# Patient Record
Sex: Female | Born: 1980 | Race: Black or African American | Hispanic: No | Smoking: Never smoker
Health system: Southern US, Community
[De-identification: ages and names within clinical notes are randomized; demographics above are authoritative.]

## PROBLEM LIST (undated history)

## (undated) HISTORY — PX: APPENDECTOMY: SHX54

---

## 2013-11-05 ENCOUNTER — Ambulatory Visit: Payer: Self-pay

## 2013-11-05 ENCOUNTER — Other Ambulatory Visit: Payer: Self-pay | Admitting: Occupational Medicine

## 2013-11-05 DIAGNOSIS — R7612 Nonspecific reaction to cell mediated immunity measurement of gamma interferon antigen response without active tuberculosis: Secondary | ICD-10-CM

## 2015-02-04 IMAGING — CR DG CHEST 1V
1 series · 1 of 1 positions shown · non-contrast
Comparison: None.

CLINICAL DATA: Positive C room TB test.

EXAM:
CHEST - 1 VIEW

[view not recorded]
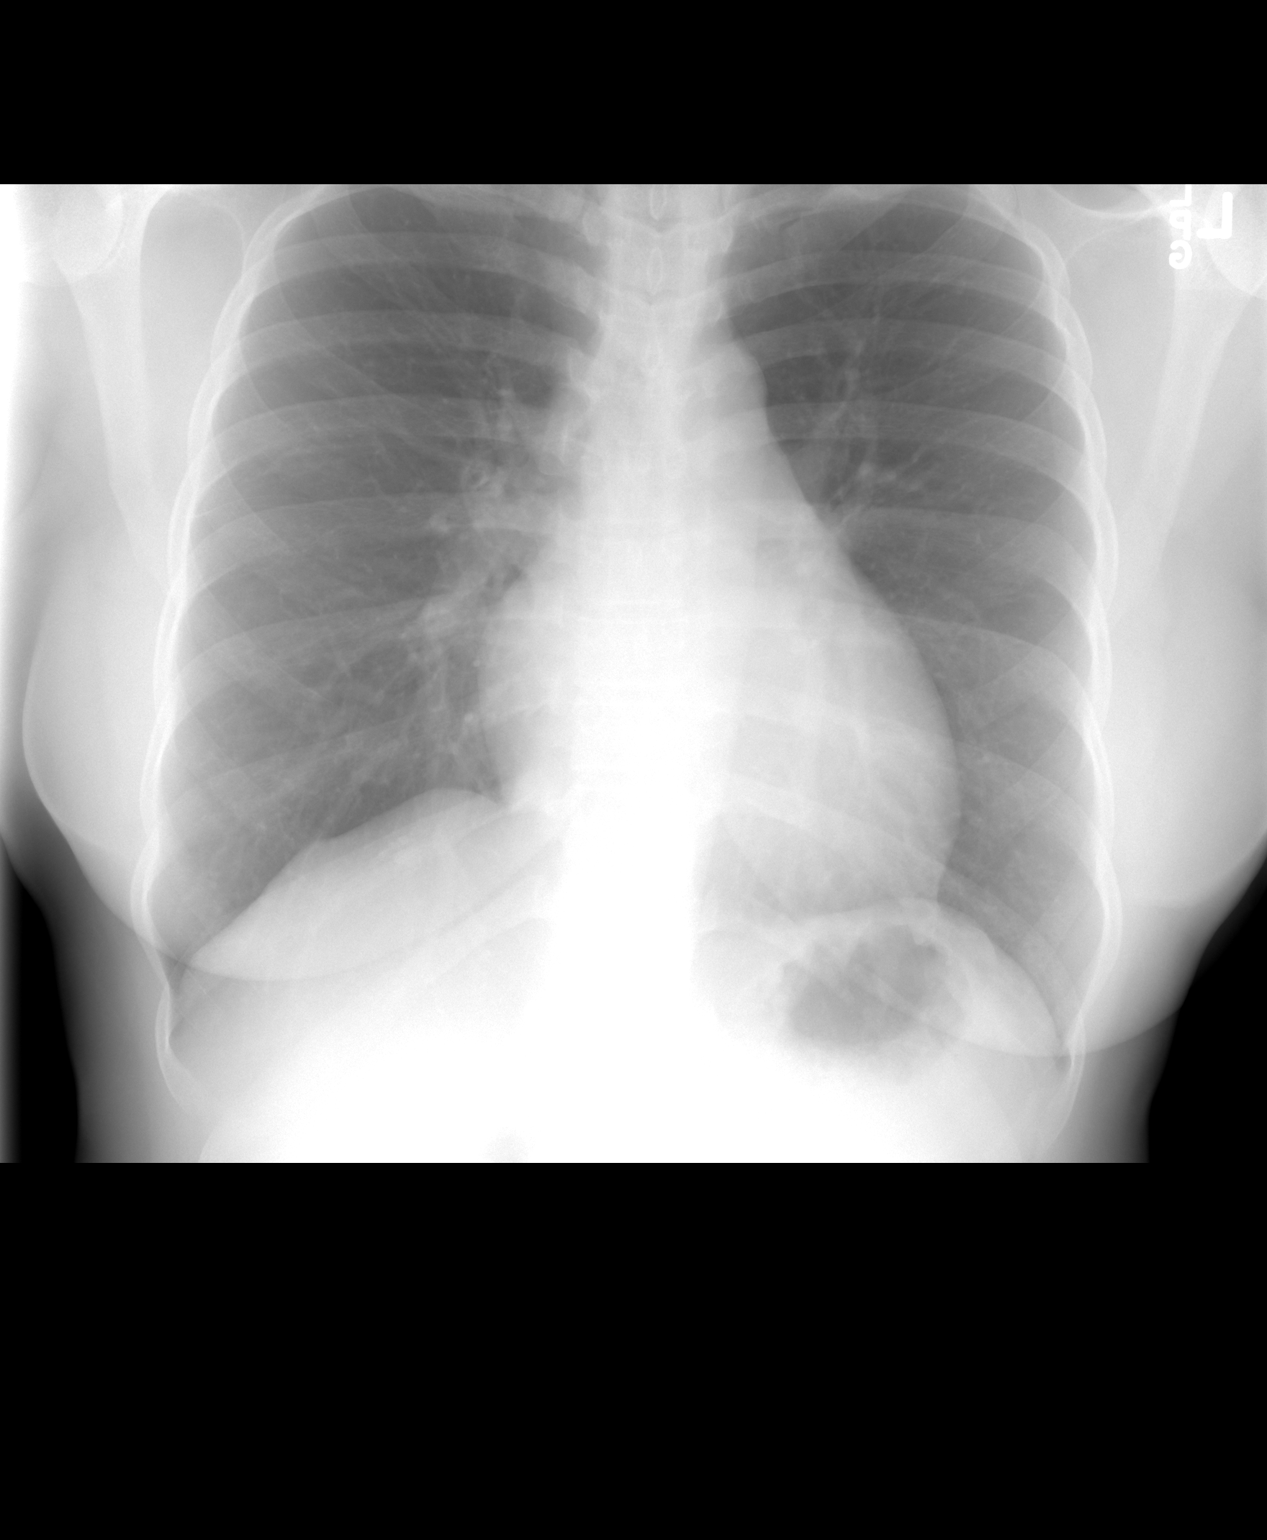

[1 of 1 positions shown; findings below may reference images not displayed]

FINDINGS: The heart size and mediastinal contours are within normal limits.
Both lungs are clear. The visualized skeletal structures are
unremarkable.
IMPRESSION: No active disease.  No evidence of active pulmonary tuberculosis.

## 2015-07-29 DIAGNOSIS — R509 Fever, unspecified: Secondary | ICD-10-CM | POA: Diagnosis not present

## 2015-07-29 DIAGNOSIS — G43109 Migraine with aura, not intractable, without status migrainosus: Secondary | ICD-10-CM | POA: Diagnosis not present

## 2015-07-29 DIAGNOSIS — J101 Influenza due to other identified influenza virus with other respiratory manifestations: Secondary | ICD-10-CM | POA: Diagnosis not present

## 2015-09-20 DIAGNOSIS — Z8249 Family history of ischemic heart disease and other diseases of the circulatory system: Secondary | ICD-10-CM | POA: Diagnosis not present

## 2015-09-20 DIAGNOSIS — I440 Atrioventricular block, first degree: Secondary | ICD-10-CM | POA: Diagnosis not present

## 2015-09-20 DIAGNOSIS — R079 Chest pain, unspecified: Secondary | ICD-10-CM | POA: Diagnosis not present

## 2015-09-20 DIAGNOSIS — M545 Low back pain: Secondary | ICD-10-CM | POA: Diagnosis not present

## 2016-04-10 DIAGNOSIS — Z30432 Encounter for removal of intrauterine contraceptive device: Secondary | ICD-10-CM | POA: Diagnosis not present

## 2016-04-10 DIAGNOSIS — Z01419 Encounter for gynecological examination (general) (routine) without abnormal findings: Secondary | ICD-10-CM | POA: Diagnosis not present

## 2016-04-10 DIAGNOSIS — Z6829 Body mass index (BMI) 29.0-29.9, adult: Secondary | ICD-10-CM | POA: Diagnosis not present

## 2016-04-10 DIAGNOSIS — Z13220 Encounter for screening for lipoid disorders: Secondary | ICD-10-CM | POA: Diagnosis not present

## 2016-06-20 DIAGNOSIS — H10022 Other mucopurulent conjunctivitis, left eye: Secondary | ICD-10-CM | POA: Diagnosis not present

## 2016-06-20 DIAGNOSIS — J309 Allergic rhinitis, unspecified: Secondary | ICD-10-CM | POA: Diagnosis not present

## 2016-06-22 DIAGNOSIS — B301 Conjunctivitis due to adenovirus: Secondary | ICD-10-CM | POA: Diagnosis not present

## 2017-04-06 DIAGNOSIS — G8929 Other chronic pain: Secondary | ICD-10-CM | POA: Diagnosis not present

## 2017-04-06 DIAGNOSIS — D500 Iron deficiency anemia secondary to blood loss (chronic): Secondary | ICD-10-CM | POA: Diagnosis not present

## 2017-04-06 DIAGNOSIS — M5441 Lumbago with sciatica, right side: Secondary | ICD-10-CM | POA: Diagnosis not present

## 2017-04-06 DIAGNOSIS — R635 Abnormal weight gain: Secondary | ICD-10-CM | POA: Diagnosis not present

## 2017-04-06 DIAGNOSIS — Z0000 Encounter for general adult medical examination without abnormal findings: Secondary | ICD-10-CM | POA: Diagnosis not present

## 2018-01-24 DIAGNOSIS — R070 Pain in throat: Secondary | ICD-10-CM | POA: Diagnosis not present

## 2018-01-24 DIAGNOSIS — R490 Dysphonia: Secondary | ICD-10-CM | POA: Diagnosis not present

## 2018-02-14 DIAGNOSIS — Z30432 Encounter for removal of intrauterine contraceptive device: Secondary | ICD-10-CM | POA: Diagnosis not present

## 2018-02-14 DIAGNOSIS — Z30430 Encounter for insertion of intrauterine contraceptive device: Secondary | ICD-10-CM | POA: Diagnosis not present

## 2018-02-14 DIAGNOSIS — Z3202 Encounter for pregnancy test, result negative: Secondary | ICD-10-CM | POA: Diagnosis not present

## 2018-02-14 DIAGNOSIS — Z124 Encounter for screening for malignant neoplasm of cervix: Secondary | ICD-10-CM | POA: Diagnosis not present

## 2018-02-15 DIAGNOSIS — Z124 Encounter for screening for malignant neoplasm of cervix: Secondary | ICD-10-CM | POA: Diagnosis not present

## 2018-04-24 DIAGNOSIS — R490 Dysphonia: Secondary | ICD-10-CM | POA: Diagnosis not present

## 2018-04-24 DIAGNOSIS — J383 Other diseases of vocal cords: Secondary | ICD-10-CM | POA: Diagnosis not present

## 2018-04-26 DIAGNOSIS — M5441 Lumbago with sciatica, right side: Secondary | ICD-10-CM | POA: Diagnosis not present

## 2018-04-26 DIAGNOSIS — Z23 Encounter for immunization: Secondary | ICD-10-CM | POA: Diagnosis not present

## 2018-04-26 DIAGNOSIS — Z0000 Encounter for general adult medical examination without abnormal findings: Secondary | ICD-10-CM | POA: Diagnosis not present

## 2018-04-26 DIAGNOSIS — D500 Iron deficiency anemia secondary to blood loss (chronic): Secondary | ICD-10-CM | POA: Diagnosis not present

## 2018-05-30 DIAGNOSIS — L658 Other specified nonscarring hair loss: Secondary | ICD-10-CM | POA: Diagnosis not present

## 2018-05-30 DIAGNOSIS — L668 Other cicatricial alopecia: Secondary | ICD-10-CM | POA: Diagnosis not present

## 2018-07-04 ENCOUNTER — Ambulatory Visit (INDEPENDENT_AMBULATORY_CARE_PROVIDER_SITE_OTHER): Payer: Self-pay | Admitting: Physician Assistant

## 2018-07-04 ENCOUNTER — Encounter: Payer: Self-pay | Admitting: Physician Assistant

## 2018-07-04 VITALS — BP 120/84 | HR 85 | Temp 99.5°F | Resp 16 | Ht 62.0 in | Wt 166.0 lb

## 2018-07-04 DIAGNOSIS — R6889 Other general symptoms and signs: Secondary | ICD-10-CM

## 2018-07-04 MED ORDER — OSELTAMIVIR PHOSPHATE 75 MG PO CAPS: 75.0000 mg | ORAL_CAPSULE | Freq: Two times a day (BID) | ORAL | 0 refills | Status: AC

## 2018-07-04 NOTE — Patient Instructions (Signed)
Thank you for choosing [facility] for your health care needs  You have been diagnosed with flu-like symptoms (the Flu).  You have been prescribed Tamiflu: take one 75mg  tablet twice a day x 5 days.  Rest. Increase fluids. Use cough & cold medication for symptom relief. Take tylenol or ibuprofen for fever and body aches.  You are contagious; practice good hand hygiene, cough in to elbow, use Clorox wipes to clean work/home area.  Follow-up with family physician or urgent care in 4-5 days if symptoms not improving.  Influenza, Adult Influenza is also called "the flu." It is an infection in the lungs, nose, and throat (respiratory tract). It is caused by a virus. The flu causes symptoms that are similar to symptoms of a cold. It also causes a high fever and body aches. The flu spreads easily from person to person (is contagious). Getting a flu shot (influenza vaccination) every year is the best way to prevent the flu. What are the causes? This condition is caused by the influenza virus. You can get the virus by:  Breathing in droplets that are in the air from the cough or sneeze of a person who has the virus.  Touching something that has the virus on it (is contaminated) and then touching your mouth, nose, or eyes. What increases the risk? Certain things may make you more likely to get the flu. These include:  Not washing your hands often.  Having close contact with many people during cold and flu season.  Touching your mouth, eyes, or nose without first washing your hands.  Not getting a flu shot every year. You may have a higher risk for the flu, along with serious problems such as a lung infection (pneumonia), if you:  Are older than 65.  Are pregnant.  Have a weakened disease-fighting system (immune system) because of a disease or taking certain medicines.  Have a long-term (chronic) illness, such as: ? Heart, kidney, or lung disease. ? Diabetes. ? Asthma.  Have a liver  disorder.  Are very overweight (morbidly obese).  Have anemia. This is a condition that affects your red blood cells. What are the signs or symptoms? Symptoms usually begin suddenly and last 4-14 days. They may include:  Fever and chills.  Headaches, body aches, or muscle aches.  Sore throat.  Cough.  Runny or stuffy (congested) nose.  Chest discomfort.  Not wanting to eat as much as normal (poor appetite).  Weakness or feeling tired (fatigue).  Dizziness.  Feeling sick to your stomach (nauseous) or throwing up (vomiting). How is this treated? If the flu is found early, you can be treated with medicine that can help reduce how bad the illness is and how long it lasts (antiviral medicine). This may be given by mouth (orally) or through an IV tube. Taking care of yourself at home can help your symptoms get better. Your doctor may suggest:  Taking over-the-counter medicines.  Drinking plenty of fluids. The flu often goes away on its own. If you have very bad symptoms or other problems, you may be treated in a hospital. Follow these instructions at home:     Activity  Rest as needed. Get plenty of sleep.  Stay home from work or school as told by your doctor. ? Do not leave home until you do not have a fever for 24 hours without taking medicine. ? Leave home only to visit your doctor. Eating and drinking  Take an ORS (oral rehydration solution). This is a  drink that is sold at pharmacies and stores.  Drink enough fluid to keep your pee (urine) pale yellow.  Drink clear fluids in small amounts as you are able. Clear fluids include: ? Water. ? Ice chips. ? Fruit juice that has water added (diluted fruit juice). ? Low-calorie sports drinks.  Eat bland, easy-to-digest foods in small amounts as you are able. These foods include: ? Bananas. ? Applesauce. ? Rice. ? Lean meats. ? Toast. ? Crackers.  Do not eat or drink: ? Fluids that have a lot of sugar or  caffeine. ? Alcohol. ? Spicy or fatty foods. General instructions  Take over-the-counter and prescription medicines only as told by your doctor.  Use a cool mist humidifier to add moisture to the air in your home. This can make it easier for you to breathe.  Cover your mouth and nose when you cough or sneeze.  Wash your hands with soap and water often, especially after you cough or sneeze. If you cannot use soap and water, use alcohol-based hand sanitizer.  Keep all follow-up visits as told by your doctor. This is important. How is this prevented?   Get a flu shot every year. You may get the flu shot in late summer, fall, or winter. Ask your doctor when you should get your flu shot.  Avoid contact with people who are sick during fall and winter (cold and flu season). Contact a doctor if:  You get new symptoms.  You have: ? Chest pain. ? Watery poop (diarrhea). ? A fever.  Your cough gets worse.  You start to have more mucus.  You feel sick to your stomach.  You throw up. Get help right away if you:  Have shortness of breath.  Have trouble breathing.  Have skin or nails that turn a bluish color.  Have very bad pain or stiffness in your neck.  Get a sudden headache.  Get sudden pain in your face or ear.  Cannot eat or drink without throwing up. Summary  Influenza ("the flu") is an infection in the lungs, nose, and throat. It is caused by a virus.  Take over-the-counter and prescription medicines only as told by your doctor.  Getting a flu shot every year is the best way to avoid getting the flu. This information is not intended to replace advice given to you by your health care provider. Make sure you discuss any questions you have with your health care provider. Document Released: 02/15/2008 Document Revised: 10/24/2017 Document Reviewed: 10/24/2017 Elsevier Interactive Patient Education  2019 [employer]

## 2018-07-04 NOTE — Progress Notes (Signed)
Patient ID: Kathleen Banks DOB: 1980/12/25 AGE: 38 y.o. MRN: [id number]   PCP: No primary care provider on file.   Chief Complaint:  Chief Complaint  Patient presents with  . Generalized Body Aches    x2d  . Chills    x2d     Subjective:    HPI:  Kathleen Banks is a 38 y.o. female presents for evaluation  Chief Complaint  Patient presents with  . Generalized Body Aches    x2d  . Chills    x63d    38 year old female presents to [facility] with three day history of flu-like symptoms. Began Tuesday 07/02/2018 morning with sore throat and rhinorrhea. By Tuesday afternoon developed body aches (primarily myalgias), sweats, chills, and fever. Has since developed associated headache, malaise/fatigue, and nasal congestion. Has taken OTC Theraflu with no symptom improvement. Last dose this morning. Denies dizziness/lightheadedness, ear pain, sinus pain (does report mild sinus pressure), cough, chest pain, SOB, wheezing, nausea/vomiting, abdominal pain, diarrhea, rash.  Patient states symptoms feel similar to previous episode of influenza, 2017. Patient did receive this season's influenza vaccination. Patient's 80 year old son had flu last week. Tested positive on rapid flu test at [facility] urgent care.  Patient denies smoking history. No asthma history (though albuterol prescription listed under medications). No COPD or emphysema. No seasonal allergies. Patient underwent CXR as screening for TB in 2014, was negative.  A limited review of symptoms was performed, pertinent positives and negatives as mentioned in HPI.  The following portions of the patient's history were reviewed and updated as appropriate: allergies, current medications and past medical history.  There are no active problems to display for this patient.   Allergies  Allergen Reactions  . Chloroquine Hives    Current Outpatient Medications on File Prior to Visit  Medication Sig Dispense Refill  . Albuterol  Sulfate 108 (90 Base) MCG/ACT AEPB Inhale into the lungs.    . clobetasol (TEMOVATE) 0.05 % external solution     . Copper (PARAGARD) IUD IUD ParaGard T 380A 380 square mm intrauterine device  Take 1 device by intrauterine route.    . Ibuprofen (MOTRIN IB PO) Motrin    . Iron-Vitamin C (IRON 100/C PO) iron    . Multiple Vitamins-Minerals (MULTIVITAMIN ADULT PO) multivitamin    . omeprazole (PRILOSEC) 40 MG capsule Take by mouth.    . SUMAtriptan (IMITREX) 100 MG tablet Imitrex  prn     No current facility-administered medications on file prior to visit.        Objective:   Vitals:   07/04/18 0947  BP: 120/84  Pulse: 85  Resp: 16  Temp: 99.5 F (37.5 C)  SpO2: 99%     Wt Readings from Last 3 Encounters:  07/04/18 166 lb (75.3 kg)    Physical Exam:   General Appearance:  Patient sitting comfortably on examination table. Conversational. Peri Jefferson self-historian. In no acute distress. 99.43F temperature.   Head:  Normocephalic, without obvious abnormality, atraumatic  Eyes:  PERRL, conjunctiva/corneas clear, EOM's intact  Ears:  Bilateral ear canals WNL. No erythema or edema. No discharge/drainage. Bilateral TMs WNL. No erythema, injection, or serous effusion. No scar tissue.  Nose: Nares normal, septum midline. Nasal mucosa with bilateral edema, scant clear rhinorrhea. No sinus tenderness with percussion/palpation.  Throat: Lips, mucosa, and tongue normal; teeth and gums normal. Throat reveals no erythema. Tonsils with no enlargement or exudate.  Neck: Supple, symmetrical, trachea midline, no adenopathy  Lungs:   Clear to auscultation  bilaterally, respirations unlabored  Heart:  Regular rate and rhythm, S1 and S2 normal, no murmur, rub, or gallop  Extremities: Extremities normal, atraumatic, no cyanosis or edema  Pulses: 2+ and symmetric  Skin: Skin color, texture, turgor normal, no rashes or lesions  Lymph nodes: Cervical, supraclavicular, and axillary nodes normal  Neurologic:  Normal    Assessment & Plan:    Exam findings, diagnosis etiology and medication use and indications reviewed with patient. Follow-Up and discharge instructions provided. No emergent/urgent issues found on exam.  Patient education was provided.   Patient verbalized understanding of information provided and agrees with plan of care (POC), all questions answered. The patient is advised to call or return to clinic if condition does not see an improvement in symptoms, or to seek the care of the closest emergency department if condition worsens with the below plan.    1. Flu-like symptoms - oseltamivir (TAMIFLU) 75 MG capsule; Take 1 capsule (75 mg total) by mouth 2 (two) times daily for 5 days.  Dispense: 10 capsule; Refill: 0   Patient with 48 hour history of flu-like symptoms (less than 48 hours since febrile). Reports fever, malaise, body aches, rhinorrhea, nasal congestion, sore throat. Known recent flu exposure; co-workers, patients (works in health care), and 47 year old son (positive rapid flu test last week). Feels similar to previous episode of flu. Patient with elevated temp in office; otherwise VSS, in no acute distress, clear lung sounds. Believe patient has uncomplicated influenza. Discussed rapid flu testing; believe unnecessary, patient can be empirically treated. Patient agreed. Prescribed Tamiflu. Advised OTC cough and cold medication. Discussed contagiousness; provided work excuse note. Discussed possibility of flu complications; advised patient f/u with PCP or urgent care in 4-5 days if symptoms not improving, sooner with any worsening symptoms. Patient agreed.   [name], MHS, PA-C [name], MHS, PA-C Advanced Practice Provider [facility]  [address], [facility], [address]st Floor [place], [place] [address] (p):  [phone] [name]

## 2018-07-08 ENCOUNTER — Telehealth: Payer: Self-pay | Admitting: Emergency Medicine

## 2018-07-08 NOTE — Telephone Encounter (Signed)
Follow up call from [facility] visit. Unable to leave message mailbox full.

## 2018-10-22 ENCOUNTER — Emergency Department (HOSPITAL_COMMUNITY): Payer: PRIVATE HEALTH INSURANCE

## 2018-10-22 ENCOUNTER — Encounter (HOSPITAL_COMMUNITY): Payer: Self-pay | Admitting: Family Medicine

## 2018-10-22 ENCOUNTER — Emergency Department (HOSPITAL_COMMUNITY)
Admission: EM | Admit: 2018-10-22 | Discharge: 2018-10-22 | Disposition: A | Discharge status: Home / Self Care | Payer: PRIVATE HEALTH INSURANCE | Attending: Emergency Medicine | Admitting: Emergency Medicine

## 2018-10-22 DIAGNOSIS — Z79899 Other long term (current) drug therapy: Secondary | ICD-10-CM | POA: Diagnosis not present

## 2018-10-22 DIAGNOSIS — S6992XA Unspecified injury of left wrist, hand and finger(s), initial encounter: Secondary | ICD-10-CM | POA: Insufficient documentation

## 2018-10-22 DIAGNOSIS — Y9389 Activity, other specified: Secondary | ICD-10-CM | POA: Insufficient documentation

## 2018-10-22 DIAGNOSIS — Y92538 Other ambulatory health services establishments as the place of occurrence of the external cause: Secondary | ICD-10-CM | POA: Diagnosis not present

## 2018-10-22 DIAGNOSIS — Y999 Unspecified external cause status: Secondary | ICD-10-CM | POA: Diagnosis not present

## 2018-10-22 NOTE — ED Notes (Signed)
Bed: WTR5 Expected date:  Expected time:  Means of arrival:  Comments: 

## 2018-10-22 NOTE — Discharge Instructions (Addendum)
Return here as needed.  Your x-rays did not show any broken bones.  Wear the splint for comfort.  You will need to follow-up with occupational health.  Tylenol and Motrin for any discomfort.
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
## 2018-10-30 DIAGNOSIS — R1030 Lower abdominal pain, unspecified: Secondary | ICD-10-CM | POA: Diagnosis not present

## 2018-10-30 DIAGNOSIS — R1031 Right lower quadrant pain: Secondary | ICD-10-CM | POA: Diagnosis not present

## 2018-10-30 DIAGNOSIS — N898 Other specified noninflammatory disorders of vagina: Secondary | ICD-10-CM | POA: Diagnosis not present

## 2018-10-30 DIAGNOSIS — R10817 Generalized abdominal tenderness: Secondary | ICD-10-CM | POA: Diagnosis not present

## 2018-10-30 DIAGNOSIS — R1032 Left lower quadrant pain: Secondary | ICD-10-CM | POA: Diagnosis not present

## 2018-10-30 DIAGNOSIS — N8302 Follicular cyst of left ovary: Secondary | ICD-10-CM | POA: Diagnosis not present

## 2018-10-30 DIAGNOSIS — R1013 Epigastric pain: Secondary | ICD-10-CM | POA: Diagnosis not present

## 2018-10-30 DIAGNOSIS — R10816 Epigastric abdominal tenderness: Secondary | ICD-10-CM | POA: Diagnosis not present

## 2018-10-30 DIAGNOSIS — R110 Nausea: Secondary | ICD-10-CM | POA: Diagnosis not present

## 2018-10-30 DIAGNOSIS — Z9049 Acquired absence of other specified parts of digestive tract: Secondary | ICD-10-CM | POA: Diagnosis not present

## 2018-10-30 DIAGNOSIS — R509 Fever, unspecified: Secondary | ICD-10-CM | POA: Diagnosis not present

## 2018-10-31 DIAGNOSIS — N760 Acute vaginitis: Secondary | ICD-10-CM | POA: Diagnosis not present

## 2018-10-31 DIAGNOSIS — Z30432 Encounter for removal of intrauterine contraceptive device: Secondary | ICD-10-CM | POA: Diagnosis not present

## 2018-10-31 DIAGNOSIS — Z01419 Encounter for gynecological examination (general) (routine) without abnormal findings: Secondary | ICD-10-CM | POA: Diagnosis not present

## 2018-10-31 DIAGNOSIS — Z6832 Body mass index (BMI) 32.0-32.9, adult: Secondary | ICD-10-CM | POA: Diagnosis not present

## 2019-05-03 ENCOUNTER — Other Ambulatory Visit: Payer: Self-pay

## 2019-05-03 DIAGNOSIS — Z20822 Contact with and (suspected) exposure to covid-19: Secondary | ICD-10-CM

## 2019-05-04 LAB — NOVEL CORONAVIRUS, NAA: SARS-CoV-2, NAA: NOT DETECTED

## 2019-07-03 ENCOUNTER — Ambulatory Visit: Payer: Self-pay | Attending: Internal Medicine

## 2019-07-03 DIAGNOSIS — Z23 Encounter for immunization: Secondary | ICD-10-CM | POA: Insufficient documentation

## 2019-07-03 NOTE — Progress Notes (Signed)
   Covid-19 Vaccination Clinic  Name:  Kathleen Banks    MRN: [id number] DOB: [birth year]  07/03/2019  Kathleen Banks was observed post Covid-19 immunization for 15 minutes without incidence. She was provided with Vaccine Information Sheet and instruction to access the V-Safe system.   Kathleen Banks was instructed to call 911 with any severe reactions post vaccine: Marland Kitchen Difficulty breathing  . Swelling of your face and throat  . A fast heartbeat  . A bad rash all over your body  . Dizziness and weakness    Immunizations Administered    Name Date Dose VIS Date Route   Pfizer COVID-19 Vaccine 07/03/2019 12:52 PM 0.3 mL 05/02/2019 Intramuscular   Manufacturer: [employer], [employer]   Lot: [id number]   NDC: [address]-7

## 2019-07-04 ENCOUNTER — Ambulatory Visit: Payer: Self-pay

## 2019-07-08 DIAGNOSIS — Z3202 Encounter for pregnancy test, result negative: Secondary | ICD-10-CM | POA: Diagnosis not present

## 2019-07-08 DIAGNOSIS — Z30430 Encounter for insertion of intrauterine contraceptive device: Secondary | ICD-10-CM | POA: Diagnosis not present

## 2019-07-23 DIAGNOSIS — Z1159 Encounter for screening for other viral diseases: Secondary | ICD-10-CM | POA: Diagnosis not present

## 2019-07-23 DIAGNOSIS — Z23 Encounter for immunization: Secondary | ICD-10-CM | POA: Diagnosis not present

## 2019-07-23 DIAGNOSIS — D500 Iron deficiency anemia secondary to blood loss (chronic): Secondary | ICD-10-CM | POA: Diagnosis not present

## 2019-07-23 DIAGNOSIS — Z13220 Encounter for screening for lipoid disorders: Secondary | ICD-10-CM | POA: Diagnosis not present

## 2019-07-23 DIAGNOSIS — Z0000 Encounter for general adult medical examination without abnormal findings: Secondary | ICD-10-CM | POA: Diagnosis not present

## 2019-08-27 DIAGNOSIS — Z30431 Encounter for routine checking of intrauterine contraceptive device: Secondary | ICD-10-CM | POA: Diagnosis not present
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
## 2020-04-01 DIAGNOSIS — X58XXXA Exposure to other specified factors, initial encounter: Secondary | ICD-10-CM | POA: Diagnosis not present

## 2020-04-01 DIAGNOSIS — S335XXA Sprain of ligaments of lumbar spine, initial encounter: Secondary | ICD-10-CM | POA: Diagnosis not present

## 2020-04-14 DIAGNOSIS — M5410 Radiculopathy, site unspecified: Secondary | ICD-10-CM | POA: Diagnosis not present

## 2020-04-14 DIAGNOSIS — G43009 Migraine without aura, not intractable, without status migrainosus: Secondary | ICD-10-CM | POA: Diagnosis not present

## 2020-04-14 DIAGNOSIS — G4700 Insomnia, unspecified: Secondary | ICD-10-CM | POA: Diagnosis not present

## 2020-08-04 DIAGNOSIS — Z20822 Contact with and (suspected) exposure to covid-19: Secondary | ICD-10-CM | POA: Diagnosis not present

## 2020-08-31 DIAGNOSIS — L669 Cicatricial alopecia, unspecified: Secondary | ICD-10-CM | POA: Diagnosis not present

## 2020-12-21 DIAGNOSIS — L669 Cicatricial alopecia, unspecified: Secondary | ICD-10-CM | POA: Diagnosis not present

## 2021-03-01 DIAGNOSIS — M722 Plantar fascial fibromatosis: Secondary | ICD-10-CM | POA: Diagnosis not present

## 2021-03-01 DIAGNOSIS — M79671 Pain in right foot: Secondary | ICD-10-CM | POA: Diagnosis not present

## 2021-03-01 DIAGNOSIS — M79672 Pain in left foot: Secondary | ICD-10-CM | POA: Diagnosis not present

## 2021-03-01 DIAGNOSIS — R519 Headache, unspecified: Secondary | ICD-10-CM | POA: Diagnosis not present

## 2021-03-01 DIAGNOSIS — G5603 Carpal tunnel syndrome, bilateral upper limbs: Secondary | ICD-10-CM | POA: Diagnosis not present

## 2021-07-10 DIAGNOSIS — B349 Viral infection, unspecified: Secondary | ICD-10-CM | POA: Diagnosis not present

## 2021-07-10 DIAGNOSIS — Z20822 Contact with and (suspected) exposure to covid-19: Secondary | ICD-10-CM | POA: Diagnosis not present

## 2021-07-10 DIAGNOSIS — R509 Fever, unspecified: Secondary | ICD-10-CM | POA: Diagnosis not present

## 2021-08-25 DIAGNOSIS — D500 Iron deficiency anemia secondary to blood loss (chronic): Secondary | ICD-10-CM | POA: Diagnosis not present

## 2021-08-25 DIAGNOSIS — Z23 Encounter for immunization: Secondary | ICD-10-CM | POA: Diagnosis not present

## 2021-08-25 DIAGNOSIS — Z1331 Encounter for screening for depression: Secondary | ICD-10-CM | POA: Diagnosis not present

## 2021-08-25 DIAGNOSIS — R041 Hemorrhage from throat: Secondary | ICD-10-CM | POA: Diagnosis not present

## 2021-08-25 DIAGNOSIS — R519 Headache, unspecified: Secondary | ICD-10-CM | POA: Diagnosis not present

## 2021-08-25 DIAGNOSIS — Z1231 Encounter for screening mammogram for malignant neoplasm of breast: Secondary | ICD-10-CM | POA: Diagnosis not present

## 2021-08-25 DIAGNOSIS — G8929 Other chronic pain: Secondary | ICD-10-CM | POA: Diagnosis not present

## 2021-08-25 DIAGNOSIS — Z0000 Encounter for general adult medical examination without abnormal findings: Secondary | ICD-10-CM | POA: Diagnosis not present

## 2022-02-08 DIAGNOSIS — Z1231 Encounter for screening mammogram for malignant neoplasm of breast: Secondary | ICD-10-CM | POA: Diagnosis not present

## 2023-01-23 DIAGNOSIS — H938X1 Other specified disorders of right ear: Secondary | ICD-10-CM | POA: Diagnosis not present

## 2023-01-23 DIAGNOSIS — R519 Headache, unspecified: Secondary | ICD-10-CM | POA: Diagnosis not present

## 2023-01-23 DIAGNOSIS — Z23 Encounter for immunization: Secondary | ICD-10-CM | POA: Diagnosis not present

## 2023-01-23 DIAGNOSIS — R7303 Prediabetes: Secondary | ICD-10-CM | POA: Diagnosis not present

## 2023-01-23 DIAGNOSIS — G43901 Migraine, unspecified, not intractable, with status migrainosus: Secondary | ICD-10-CM | POA: Diagnosis not present

## 2023-01-23 DIAGNOSIS — Z0000 Encounter for general adult medical examination without abnormal findings: Secondary | ICD-10-CM | POA: Diagnosis not present

## 2023-01-23 DIAGNOSIS — Z1331 Encounter for screening for depression: Secondary | ICD-10-CM | POA: Diagnosis not present

## 2023-01-23 DIAGNOSIS — R6889 Other general symptoms and signs: Secondary | ICD-10-CM | POA: Diagnosis not present

## 2023-01-23 DIAGNOSIS — D649 Anemia, unspecified: Secondary | ICD-10-CM | POA: Diagnosis not present

## 2023-01-23 DIAGNOSIS — Z1330 Encounter for screening examination for mental health and behavioral disorders, unspecified: Secondary | ICD-10-CM | POA: Diagnosis not present

## 2023-01-24 DIAGNOSIS — R6889 Other general symptoms and signs: Secondary | ICD-10-CM | POA: Diagnosis not present

## 2023-01-24 DIAGNOSIS — R7303 Prediabetes: Secondary | ICD-10-CM | POA: Diagnosis not present

## 2023-01-24 DIAGNOSIS — Z0000 Encounter for general adult medical examination without abnormal findings: Secondary | ICD-10-CM | POA: Diagnosis not present

## 2023-09-26 DIAGNOSIS — J301 Allergic rhinitis due to pollen: Secondary | ICD-10-CM | POA: Diagnosis not present

## 2023-09-26 DIAGNOSIS — J208 Acute bronchitis due to other specified organisms: Secondary | ICD-10-CM | POA: Diagnosis not present

## 2023-09-26 DIAGNOSIS — B9689 Other specified bacterial agents as the cause of diseases classified elsewhere: Secondary | ICD-10-CM | POA: Diagnosis not present

## 2023-09-26 DIAGNOSIS — R062 Wheezing: Secondary | ICD-10-CM | POA: Diagnosis not present

## 2023-10-26 DIAGNOSIS — Z01419 Encounter for gynecological examination (general) (routine) without abnormal findings: Secondary | ICD-10-CM | POA: Diagnosis not present

## 2023-10-26 DIAGNOSIS — R6889 Other general symptoms and signs: Secondary | ICD-10-CM | POA: Diagnosis not present

## 2023-10-26 DIAGNOSIS — Z6832 Body mass index (BMI) 32.0-32.9, adult: Secondary | ICD-10-CM | POA: Diagnosis not present

## 2023-11-15 DIAGNOSIS — D251 Intramural leiomyoma of uterus: Secondary | ICD-10-CM | POA: Diagnosis not present

## 2023-11-15 DIAGNOSIS — N921 Excessive and frequent menstruation with irregular cycle: Secondary | ICD-10-CM | POA: Diagnosis not present

## 2023-11-15 DIAGNOSIS — Z975 Presence of (intrauterine) contraceptive device: Secondary | ICD-10-CM | POA: Diagnosis not present

## 2023-11-15 DIAGNOSIS — N83201 Unspecified ovarian cyst, right side: Secondary | ICD-10-CM | POA: Diagnosis not present

## 2023-11-15 DIAGNOSIS — N83202 Unspecified ovarian cyst, left side: Secondary | ICD-10-CM | POA: Diagnosis not present

## 2023-11-15 DIAGNOSIS — N83291 Other ovarian cyst, right side: Secondary | ICD-10-CM | POA: Diagnosis not present

## 2023-11-15 DIAGNOSIS — N939 Abnormal uterine and vaginal bleeding, unspecified: Secondary | ICD-10-CM | POA: Diagnosis not present
